# Patient Record
Sex: Male | Born: 1945 | Race: Black or African American | Hispanic: No | Marital: Married | Smoking: Never smoker
Health system: Southern US, Community
[De-identification: ages and names within clinical notes are randomized; demographics above are authoritative.]

## PROBLEM LIST (undated history)

## (undated) DIAGNOSIS — E119 Type 2 diabetes mellitus without complications: Secondary | ICD-10-CM

## (undated) DIAGNOSIS — I10 Essential (primary) hypertension: Secondary | ICD-10-CM

## (undated) HISTORY — PX: CYST EXCISION: SHX5701

## (undated) HISTORY — PX: HYDROCELE EXCISION: SHX482

---

## 2014-08-06 DIAGNOSIS — H26492 Other secondary cataract, left eye: Secondary | ICD-10-CM | POA: Diagnosis not present

## 2014-09-04 DIAGNOSIS — H26492 Other secondary cataract, left eye: Secondary | ICD-10-CM | POA: Diagnosis not present

## 2014-09-18 DIAGNOSIS — Z0000 Encounter for general adult medical examination without abnormal findings: Secondary | ICD-10-CM | POA: Diagnosis not present

## 2014-09-18 DIAGNOSIS — N529 Male erectile dysfunction, unspecified: Secondary | ICD-10-CM | POA: Diagnosis not present

## 2014-09-18 DIAGNOSIS — Z1389 Encounter for screening for other disorder: Secondary | ICD-10-CM | POA: Diagnosis not present

## 2014-09-18 DIAGNOSIS — E780 Pure hypercholesterolemia: Secondary | ICD-10-CM | POA: Diagnosis not present

## 2014-09-18 DIAGNOSIS — I10 Essential (primary) hypertension: Secondary | ICD-10-CM | POA: Diagnosis not present

## 2015-01-08 DIAGNOSIS — I10 Essential (primary) hypertension: Secondary | ICD-10-CM | POA: Diagnosis not present

## 2015-03-06 DIAGNOSIS — I10 Essential (primary) hypertension: Secondary | ICD-10-CM | POA: Diagnosis not present

## 2015-03-06 DIAGNOSIS — E785 Hyperlipidemia, unspecified: Secondary | ICD-10-CM | POA: Diagnosis not present

## 2015-03-06 DIAGNOSIS — G459 Transient cerebral ischemic attack, unspecified: Secondary | ICD-10-CM | POA: Diagnosis not present

## 2015-03-07 ENCOUNTER — Other Ambulatory Visit: Payer: Self-pay | Admitting: Family Medicine

## 2015-03-11 ENCOUNTER — Other Ambulatory Visit: Payer: Self-pay | Admitting: Family Medicine

## 2015-03-11 DIAGNOSIS — F40240 Claustrophobia: Secondary | ICD-10-CM

## 2015-03-11 DIAGNOSIS — G459 Transient cerebral ischemic attack, unspecified: Secondary | ICD-10-CM

## 2015-03-12 ENCOUNTER — Other Ambulatory Visit: Payer: Self-pay | Admitting: Family Medicine

## 2015-03-12 DIAGNOSIS — G459 Transient cerebral ischemic attack, unspecified: Secondary | ICD-10-CM

## 2015-03-16 DIAGNOSIS — I6521 Occlusion and stenosis of right carotid artery: Secondary | ICD-10-CM | POA: Diagnosis not present

## 2015-03-27 ENCOUNTER — Ambulatory Visit
Admission: RE | Admit: 2015-03-27 | Discharge: 2015-03-27 | Disposition: A | Discharge status: Home / Self Care | Payer: Commercial Managed Care - HMO | Source: Ambulatory Visit | Attending: Family Medicine | Admitting: Family Medicine

## 2015-03-27 DIAGNOSIS — G459 Transient cerebral ischemic attack, unspecified: Secondary | ICD-10-CM

## 2015-03-27 DIAGNOSIS — G8323 Monoplegia of upper limb affecting right nondominant side: Secondary | ICD-10-CM | POA: Diagnosis not present

## 2015-03-27 DIAGNOSIS — M25511 Pain in right shoulder: Secondary | ICD-10-CM | POA: Diagnosis not present

## 2015-03-27 DIAGNOSIS — F40240 Claustrophobia: Secondary | ICD-10-CM

## 2015-04-01 DIAGNOSIS — I10 Essential (primary) hypertension: Secondary | ICD-10-CM | POA: Diagnosis not present

## 2015-04-22 DIAGNOSIS — I10 Essential (primary) hypertension: Secondary | ICD-10-CM | POA: Diagnosis not present

## 2015-04-29 DIAGNOSIS — H5213 Myopia, bilateral: Secondary | ICD-10-CM | POA: Diagnosis not present

## 2015-04-29 DIAGNOSIS — Z0100 Encounter for examination of eyes and vision without abnormal findings: Secondary | ICD-10-CM | POA: Diagnosis not present

## 2015-04-29 DIAGNOSIS — H524 Presbyopia: Secondary | ICD-10-CM | POA: Diagnosis not present

## 2015-04-29 DIAGNOSIS — H5203 Hypermetropia, bilateral: Secondary | ICD-10-CM | POA: Diagnosis not present

## 2015-06-11 DIAGNOSIS — R05 Cough: Secondary | ICD-10-CM | POA: Diagnosis not present

## 2015-06-11 DIAGNOSIS — R509 Fever, unspecified: Secondary | ICD-10-CM | POA: Diagnosis not present

## 2015-07-03 DIAGNOSIS — R05 Cough: Secondary | ICD-10-CM | POA: Diagnosis not present

## 2015-09-22 DIAGNOSIS — E7800 Pure hypercholesterolemia, unspecified: Secondary | ICD-10-CM | POA: Diagnosis not present

## 2015-09-22 DIAGNOSIS — Z1389 Encounter for screening for other disorder: Secondary | ICD-10-CM | POA: Diagnosis not present

## 2015-09-22 DIAGNOSIS — I10 Essential (primary) hypertension: Secondary | ICD-10-CM | POA: Diagnosis not present

## 2015-09-22 DIAGNOSIS — Z1211 Encounter for screening for malignant neoplasm of colon: Secondary | ICD-10-CM | POA: Diagnosis not present

## 2015-09-22 DIAGNOSIS — Z0000 Encounter for general adult medical examination without abnormal findings: Secondary | ICD-10-CM | POA: Diagnosis not present

## 2015-09-22 DIAGNOSIS — N529 Male erectile dysfunction, unspecified: Secondary | ICD-10-CM | POA: Diagnosis not present

## 2015-10-02 DIAGNOSIS — Z1211 Encounter for screening for malignant neoplasm of colon: Secondary | ICD-10-CM | POA: Diagnosis not present

## 2016-01-18 IMAGING — MR MR HEAD W/O CM
10 series · 48 of 48 positions shown · non-contrast
Comparison: None.

CLINICAL DATA: Recent episode of RIGHT shoulder pain and RIGHT arm
paralysis. Stroke risk factors include hypertension. Suspected
transient cerebral ischemia.

EXAM:
MRI HEAD WITHOUT CONTRAST
TECHNIQUE: Multiplanar, multiecho pulse sequences of the brain and surrounding
structures were obtained without intravenous contrast.

[Series 2: t1_se_sag · sagittal · 5.0mm · 0.45mm/px · 3 of 21 slices shown]
[im 1/21]
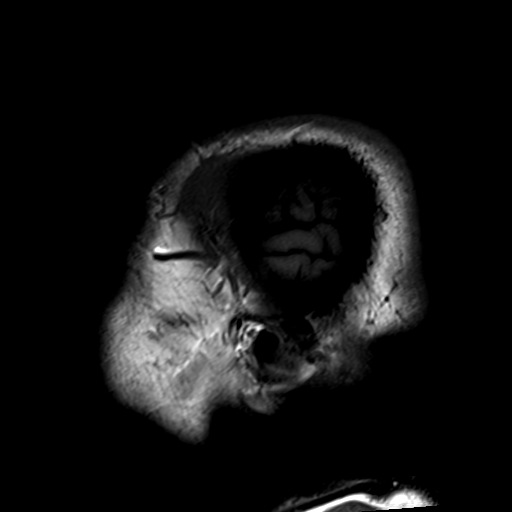
[im 11/21]
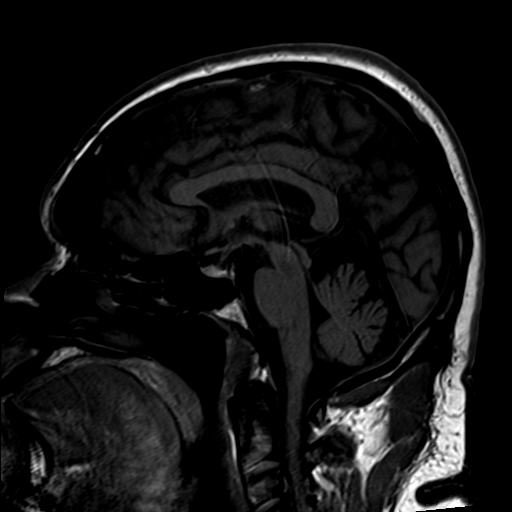
[im 21/21]
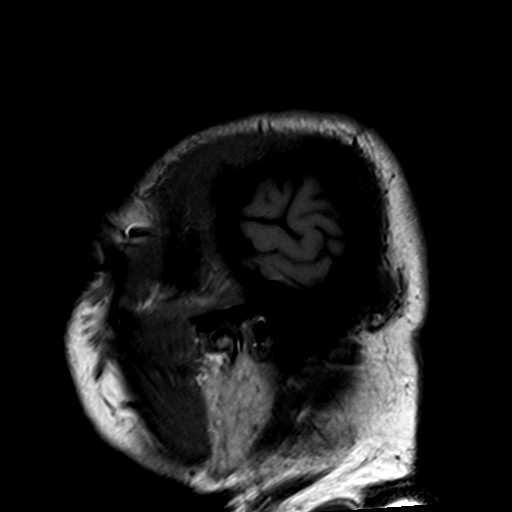

[Series 3: ep2d_diff_(id)_trace · axial · 3.0mm · 1.80mm/px · z∈[-53,+94]mm · 10 of 100 slices shown]
[im 1/100]
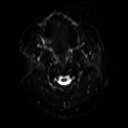
[im 12/100]
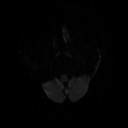
[im 23/100]
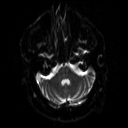
[im 34/100]
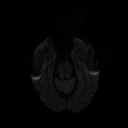
[im 45/100]
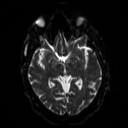
[im 56/100]
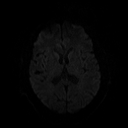
[im 67/100]
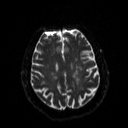
[im 78/100]
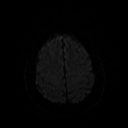
[im 89/100]
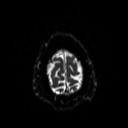
[im 100/100]
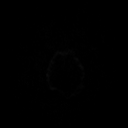

[Series 4: ep2d_diff_(id)_trace_adc · axial · 3.0mm · 1.80mm/px · z∈[-53,+94]mm · 5 of 49 slices shown]
[im 1/49]
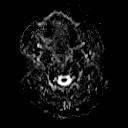
[im 13/49]
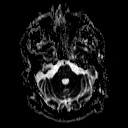
[im 25/49]
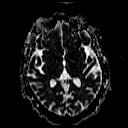
[im 37/49]
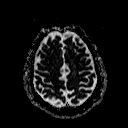
[im 49/49]
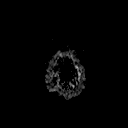

[Series 5: ep2d_diff_cor · coronal · 5.0mm · 1.77mm/px · 5 of 47 slices shown]
[im 1/47]
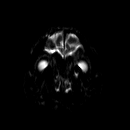
[im 12/47]
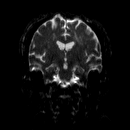
[im 24/47]
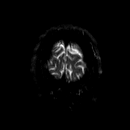
[im 35/47]
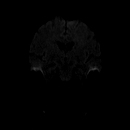
[im 47/47]
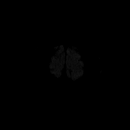

[Series 6: ep2d_diff_cor_adc · coronal · 5.0mm · 1.77mm/px · 2 of 24 slices shown]
[im 1/24]
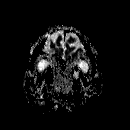
[im 24/24]
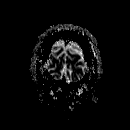

[Series 8: swi_images · axial · 2.0mm · 0.90mm/px · z∈[-58,+100]mm · 8 of 80 slices shown]
[im 1/80]
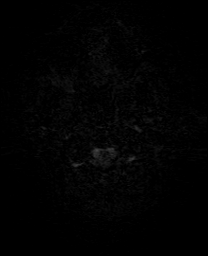
[im 12/80]
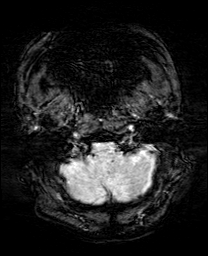
[im 23/80]
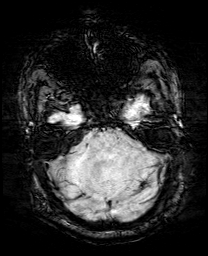
[im 34/80]
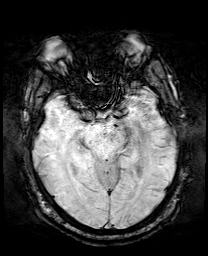
[im 46/80]
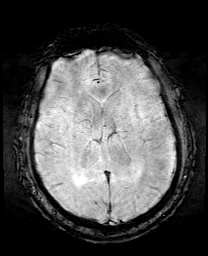
[im 57/80]
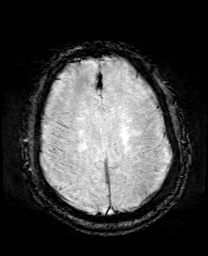
[im 68/80]
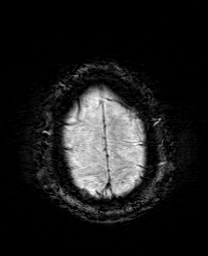
[im 80/80]
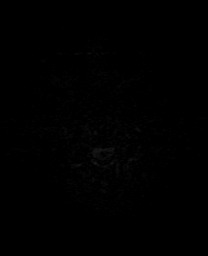

[Series 9: FLAIR · axial · 5.0mm · 0.45mm/px · z∈[-48,+89]mm · 2 of 23 slices shown]
[im 1/23]
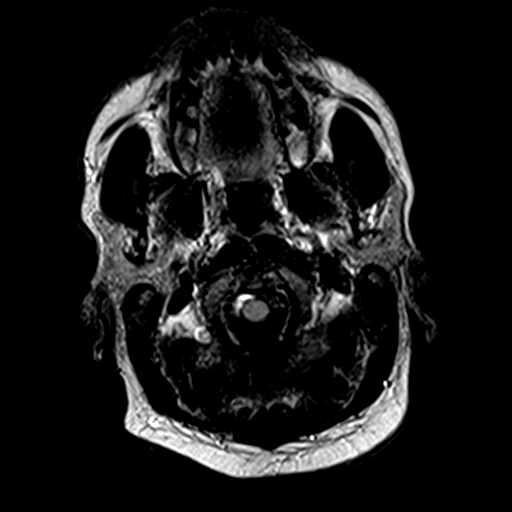
[im 23/23]
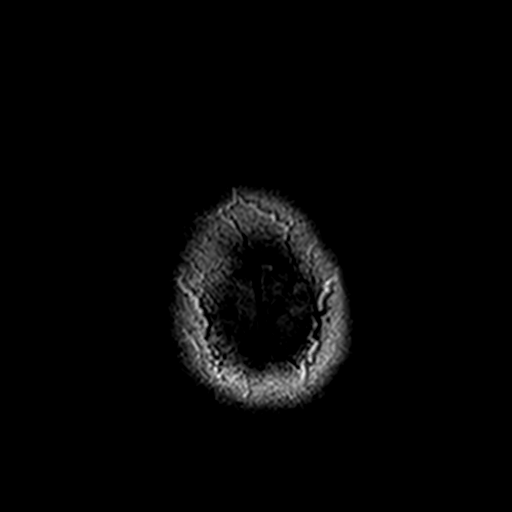

[Series 10: t2_tse_tra_512 · axial · 5.0mm · 0.60mm/px · z∈[-48,+89]mm · 2 of 23 slices shown]
[im 1/23]
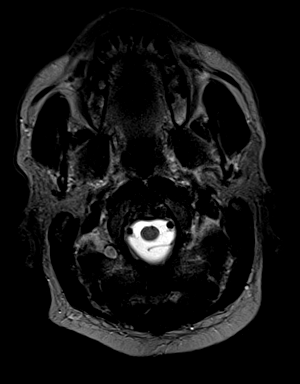
[im 23/23]
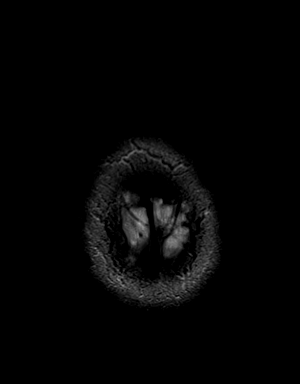

[Series 11: t1_mpr_tra · axial · 2.0mm · 0.45mm/px · z∈[-58,+100]mm · 8 of 80 slices shown]
[im 1/80]
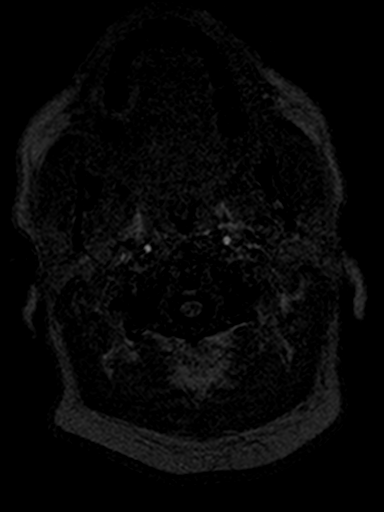
[im 12/80]
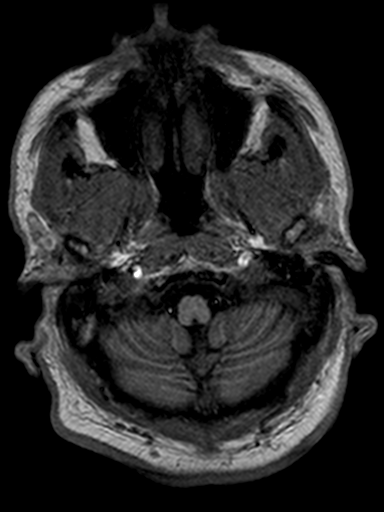
[im 23/80]
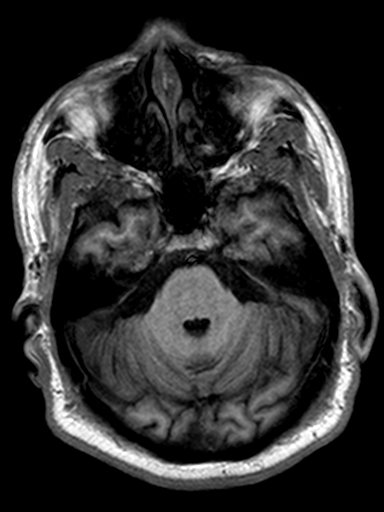
[im 34/80]
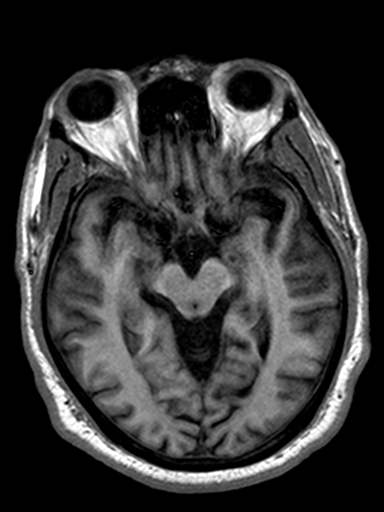
[im 46/80]
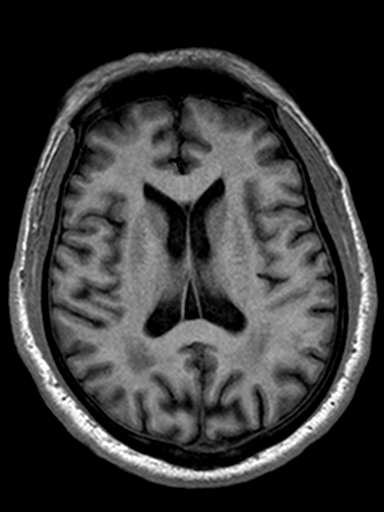
[im 57/80]
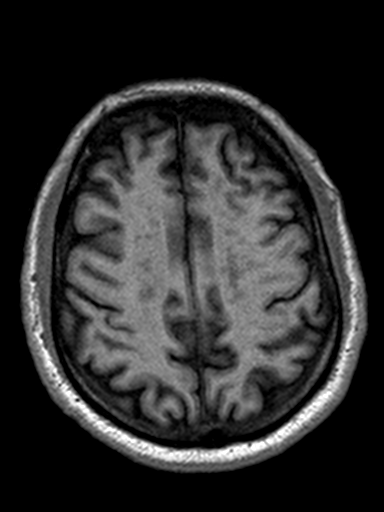
[im 68/80]
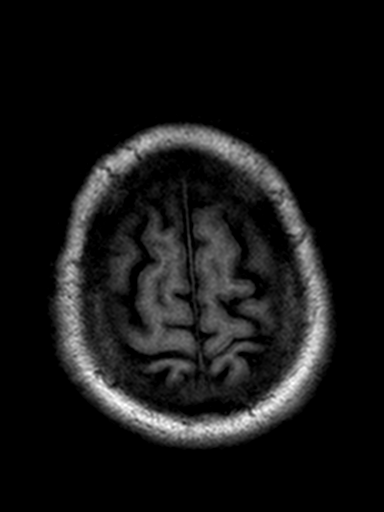
[im 80/80]
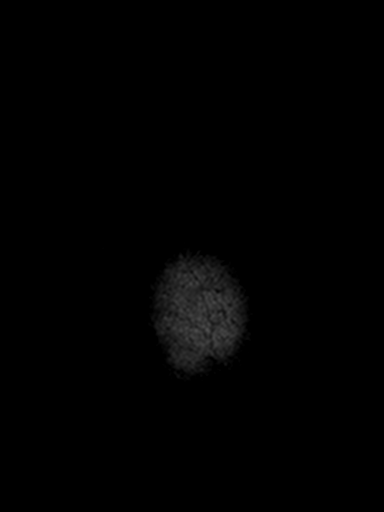

[Series 12: T2 · coronal · 5.0mm · 0.45mm/px · 3 of 26 slices shown]
[im 1/26]
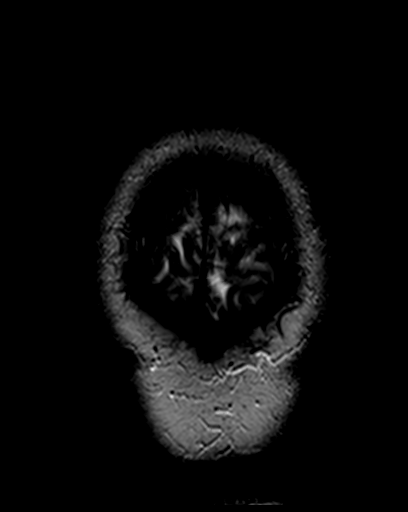
[im 13/26]
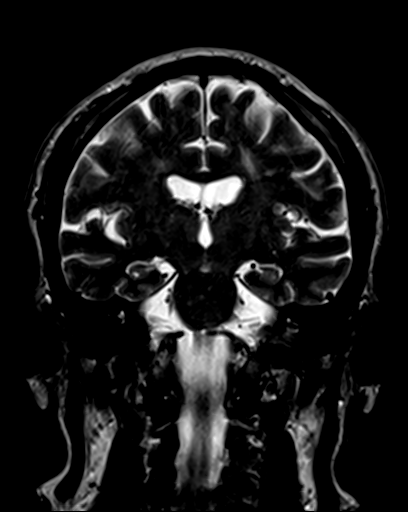
[im 26/26]
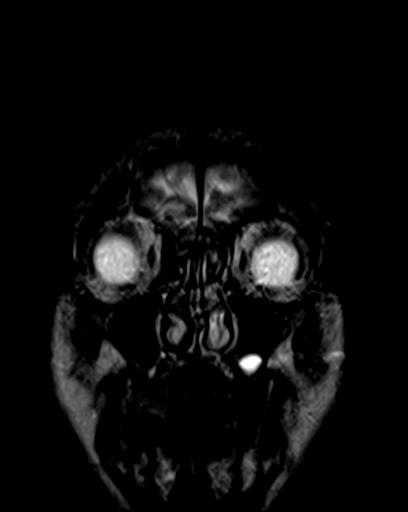

[48 of 48 positions shown; findings below may reference images not displayed]

FINDINGS: No evidence for acute infarction, hemorrhage, mass lesion,
hydrocephalus, or extra-axial fluid. Generalized atrophy. Moderately
extensive small vessel disease. No foci of chronic hemorrhage. Flow
voids are maintained. Cervical spondylosis. Normal pituitary and
cerebellar tonsils. Extracranial soft tissues unremarkable.
IMPRESSION: No acute infarct is evident.

Atrophy with moderately advanced small vessel disease. No visible
large vessel occlusion.

## 2016-03-22 DIAGNOSIS — E7800 Pure hypercholesterolemia, unspecified: Secondary | ICD-10-CM | POA: Diagnosis not present

## 2016-03-22 DIAGNOSIS — N529 Male erectile dysfunction, unspecified: Secondary | ICD-10-CM | POA: Diagnosis not present

## 2016-03-22 DIAGNOSIS — I10 Essential (primary) hypertension: Secondary | ICD-10-CM | POA: Diagnosis not present

## 2016-06-20 DIAGNOSIS — H0011 Chalazion right upper eyelid: Secondary | ICD-10-CM | POA: Diagnosis not present

## 2016-06-27 DIAGNOSIS — H26492 Other secondary cataract, left eye: Secondary | ICD-10-CM | POA: Diagnosis not present

## 2016-09-12 DIAGNOSIS — H6092 Unspecified otitis externa, left ear: Secondary | ICD-10-CM | POA: Diagnosis not present

## 2016-10-24 DIAGNOSIS — Z6837 Body mass index (BMI) 37.0-37.9, adult: Secondary | ICD-10-CM | POA: Diagnosis not present

## 2016-10-24 DIAGNOSIS — N529 Male erectile dysfunction, unspecified: Secondary | ICD-10-CM | POA: Diagnosis not present

## 2016-10-24 DIAGNOSIS — Z0000 Encounter for general adult medical examination without abnormal findings: Secondary | ICD-10-CM | POA: Diagnosis not present

## 2016-10-24 DIAGNOSIS — I10 Essential (primary) hypertension: Secondary | ICD-10-CM | POA: Diagnosis not present

## 2016-10-24 DIAGNOSIS — Z1159 Encounter for screening for other viral diseases: Secondary | ICD-10-CM | POA: Diagnosis not present

## 2016-10-24 DIAGNOSIS — E7800 Pure hypercholesterolemia, unspecified: Secondary | ICD-10-CM | POA: Diagnosis not present

## 2016-10-24 DIAGNOSIS — Z1389 Encounter for screening for other disorder: Secondary | ICD-10-CM | POA: Diagnosis not present

## 2017-01-04 ENCOUNTER — Encounter: Payer: Self-pay | Admitting: Pharmacy Technician

## 2017-01-05 ENCOUNTER — Other Ambulatory Visit: Payer: Self-pay | Admitting: Pharmacy Technician

## 2017-01-05 NOTE — Patient Outreach (Signed)
[facility]) Care Management  01/05/2017  Mike Freeman [birth year] [id number]   Contacted [facility] to verify last refill of Pravastatin 40mg . Caryl Pina verified last time script filled was June 18 for a 30 day supply.  Contacted patient in regards to Pravastatin medication adherence. No answer, left [facility] appropriate voicemail.  Maud Deed [place], [place] Management [phone]

## 2017-08-14 DEATH — deceased

## 2019-01-15 DEATH — deceased

## 2019-04-26 DIAGNOSIS — C61 Malignant neoplasm of prostate: Secondary | ICD-10-CM | POA: Diagnosis not present

## 2020-06-18 DIAGNOSIS — Z6833 Body mass index (BMI) 33.0-33.9, adult: Secondary | ICD-10-CM | POA: Diagnosis not present

## 2020-06-18 DIAGNOSIS — R7309 Other abnormal glucose: Secondary | ICD-10-CM | POA: Diagnosis not present

## 2020-12-24 DIAGNOSIS — Z961 Presence of intraocular lens: Secondary | ICD-10-CM | POA: Diagnosis not present

## 2020-12-24 DIAGNOSIS — H2511 Age-related nuclear cataract, right eye: Secondary | ICD-10-CM | POA: Diagnosis not present

## 2020-12-24 DIAGNOSIS — H524 Presbyopia: Secondary | ICD-10-CM | POA: Diagnosis not present

## 2021-10-04 DIAGNOSIS — R350 Frequency of micturition: Secondary | ICD-10-CM | POA: Diagnosis not present

## 2021-10-04 DIAGNOSIS — N401 Enlarged prostate with lower urinary tract symptoms: Secondary | ICD-10-CM | POA: Diagnosis not present

## 2021-10-04 DIAGNOSIS — R3912 Poor urinary stream: Secondary | ICD-10-CM | POA: Diagnosis not present

## 2021-12-28 DIAGNOSIS — H5201 Hypermetropia, right eye: Secondary | ICD-10-CM | POA: Diagnosis not present

## 2021-12-28 DIAGNOSIS — H2511 Age-related nuclear cataract, right eye: Secondary | ICD-10-CM | POA: Diagnosis not present

## 2021-12-28 DIAGNOSIS — H5212 Myopia, left eye: Secondary | ICD-10-CM | POA: Diagnosis not present

## 2021-12-28 DIAGNOSIS — H26492 Other secondary cataract, left eye: Secondary | ICD-10-CM | POA: Diagnosis not present

## 2022-02-10 DIAGNOSIS — R972 Elevated prostate specific antigen [PSA]: Secondary | ICD-10-CM | POA: Diagnosis not present

## 2022-02-17 DIAGNOSIS — R3912 Poor urinary stream: Secondary | ICD-10-CM | POA: Diagnosis not present

## 2022-02-17 DIAGNOSIS — N401 Enlarged prostate with lower urinary tract symptoms: Secondary | ICD-10-CM | POA: Diagnosis not present

## 2022-02-23 ENCOUNTER — Other Ambulatory Visit: Payer: Self-pay | Admitting: Urology

## 2022-02-23 DIAGNOSIS — N401 Enlarged prostate with lower urinary tract symptoms: Secondary | ICD-10-CM

## 2022-02-23 DIAGNOSIS — R3912 Poor urinary stream: Secondary | ICD-10-CM

## 2022-02-23 DIAGNOSIS — R972 Elevated prostate specific antigen [PSA]: Secondary | ICD-10-CM

## 2022-03-15 ENCOUNTER — Ambulatory Visit
Admission: RE | Admit: 2022-03-15 | Discharge: 2022-03-15 | Disposition: A | Discharge status: Home / Self Care | Payer: Medicare PPO | Source: Ambulatory Visit | Attending: Urology | Admitting: Urology

## 2022-03-15 DIAGNOSIS — R972 Elevated prostate specific antigen [PSA]: Secondary | ICD-10-CM

## 2022-03-15 DIAGNOSIS — N401 Enlarged prostate with lower urinary tract symptoms: Secondary | ICD-10-CM

## 2022-03-15 DIAGNOSIS — R3912 Poor urinary stream: Secondary | ICD-10-CM

## 2022-03-15 MED ORDER — GADOPICLENOL 0.5 MMOL/ML IV SOLN
10.0000 mL | Freq: Once | INTRAVENOUS | Status: AC | PRN
  Administered 2022-03-15: 10 mL via INTRAVENOUS

## 2022-04-21 DIAGNOSIS — R972 Elevated prostate specific antigen [PSA]: Secondary | ICD-10-CM | POA: Diagnosis not present

## 2022-04-21 DIAGNOSIS — R350 Frequency of micturition: Secondary | ICD-10-CM | POA: Diagnosis not present

## 2022-04-21 DIAGNOSIS — R3121 Asymptomatic microscopic hematuria: Secondary | ICD-10-CM | POA: Diagnosis not present

## 2022-04-21 DIAGNOSIS — N401 Enlarged prostate with lower urinary tract symptoms: Secondary | ICD-10-CM | POA: Diagnosis not present

## 2022-05-03 ENCOUNTER — Other Ambulatory Visit: Payer: Self-pay | Admitting: Urology

## 2022-05-27 NOTE — Patient Instructions (Signed)
DUE TO COVID-19 ONLY TWO VISITORS  (aged 76 and older)  ARE ALLOWED TO COME WITH YOU AND STAY IN THE WAITING ROOM ONLY DURING PRE OP AND PROCEDURE.   **NO VISITORS ARE ALLOWED IN THE SHORT STAY AREA OR RECOVERY ROOM!!**  IF YOU WILL BE ADMITTED INTO THE HOSPITAL YOU ARE ALLOWED ONLY FOUR SUPPORT PEOPLE DURING VISITATION HOURS ONLY (7 AM -8PM)   The support person(s) must pass our screening, gel in and out, and wear a mask at all times, including in the patient's room. Patients must also wear a mask when staff or their support person are in the room. Visitors GUEST BADGE MUST BE WORN VISIBLY  One adult visitor may remain with you overnight and MUST be in the room by 8 P.M.     Your procedure is scheduled on: 06/10/22   Report to [facility] Main Entrance    Report to admitting at : 6:45 AM   Call this number if you have problems the morning of surgery [phone]   Do not eat food :After Midnight.   After Midnight you may have the following liquids until : 6:00 AM DAY OF SURGERY  Water Black Coffee (sugar ok, NO MILK/CREAM OR CREAMERS)  Tea (sugar ok, NO MILK/CREAM OR CREAMERS) regular and decaf                             Plain Jell-O (NO RED)                                           Fruit ices (not with fruit pulp, NO RED)                                     Popsicles (NO RED)                                                                  Juice: apple, WHITE grape, WHITE cranberry Sports drinks like Gatorade (NO RED)             FOLLOW BOWEL PREP AND ANY ADDITIONAL PRE OP INSTRUCTIONS YOU RECEIVED FROM YOUR SURGEON'S OFFICE!!!   Oral Hygiene is also important to reduce your risk of infection.                                    Remember - BRUSH YOUR TEETH THE MORNING OF SURGERY WITH YOUR REGULAR TOOTHPASTE  DENTURES WILL BE REMOVED PRIOR TO SURGERY PLEASE DO NOT APPLY "Poly grip" OR ADHESIVES!!!   Do NOT smoke after Midnight   Take these medicines the morning of  surgery with A SIP OF WATER: amlodipine,finasteride.    How to Manage Your Diabetes Before and After Surgery  Why is it important to control my blood sugar before and after surgery? Improving blood sugar levels before and after surgery helps healing and can limit problems. A way of improving blood sugar control is eating a healthy diet by:  Eating less sugar and carbohydrates  Increasing activity/exercise  Talking with your doctor about reaching your blood sugar goals High blood sugars (greater than 180 mg/dL) can raise your risk of infections and slow your recovery, so you will need to focus on controlling your diabetes during the weeks before surgery. Make sure that the doctor who takes care of your diabetes knows about your planned surgery including the date and location.  How do I manage my blood sugar before surgery? Check your blood sugar at least 4 times a day, starting 2 days before surgery, to make sure that the level is not too high or low. Check your blood sugar the morning of your surgery when you wake up and every 2 hours until you get to the Short Stay unit. If your blood sugar is less than 70 mg/dL, you will need to treat for low blood sugar: Do not take insulin. Treat a low blood sugar (less than 70 mg/dL) with  cup of clear juice (cranberry or apple), 4 glucose tablets, OR glucose gel. Recheck blood sugar in 15 minutes after treatment (to make sure it is greater than 70 mg/dL). If your blood sugar is not greater than 70 mg/dL on recheck, call [phone] for further instructions. Report your blood sugar to the short stay nurse when you get to Short Stay.  If you are admitted to the hospital after surgery: Your blood sugar will be checked by the staff and you will probably be given insulin after surgery (instead of oral diabetes medicines) to make sure you have good blood sugar levels. The goal for blood sugar control after surgery is 80-180 mg/dL.   WHAT DO I DO ABOUT  MY DIABETES MEDICATION?  Do not take oral diabetes medicines (pills) the morning of surgery.  THE NIGHT BEFORE SURGERY, take metformin as usual.       THE MORNING OF SURGERY,DO NOT TAKE ANY ORAL DIABETIC MEDICATIONS DAY OF YOUR SURGERY  Bring CPAP mask and tubing day of surgery.                              You may not have any metal on your body including hair pins, jewelry, and body piercing             Do not wear lotions, powders, perfumes/cologne, or deodorant              Men may shave face and neck.   Do not bring valuables to the hospital. [place].   Contacts, glasses, or bridgework may not be worn into surgery.   Bring small overnight bag day of surgery.   DO NOT [place]. PHARMACY WILL DISPENSE MEDICATIONS LISTED ON YOUR MEDICATION LIST TO YOU DURING YOUR ADMISSION [place]!    Patients discharged on the day of surgery will not be allowed to drive home.  Someone NEEDS to stay with you for the first 24 hours after anesthesia.   Special Instructions: Bring a copy of your healthcare power of attorney and living will documents         the day of surgery if you haven't scanned them before.              Please read over the following fact sheets you were given: IF YOU HAVE QUESTIONS ABOUT  YOUR PRE-OP INSTRUCTIONS PLEASE CALL [phone]    [facility] - Preparing for Surgery Before surgery, you can play an important role.  Because skin is not sterile, your skin needs to be as free of germs as possible.  You can reduce the number of germs on your skin by washing with CHG (chlorahexidine gluconate) soap before surgery.  CHG is an antiseptic cleaner which kills germs and bonds with the skin to continue killing germs even after washing. Please DO NOT use if you have an allergy to CHG or antibacterial soaps.  If your skin becomes reddened/irritated stop using the CHG and inform your nurse  when you arrive at Short Stay. Do not shave (including legs and underarms) for at least 48 hours prior to the first CHG shower.  You may shave your face/neck. Please follow these instructions carefully:  1.  Shower with CHG Soap the night before surgery and the  morning of Surgery.  2.  If you choose to wash your hair, wash your hair first as usual with your  normal  shampoo.  3.  After you shampoo, rinse your hair and body thoroughly to remove the  shampoo.                           4.  Use CHG as you would any other liquid soap.  You can apply chg directly  to the skin and wash                       Gently with a scrungie or clean washcloth.  5.  Apply the CHG Soap to your body ONLY FROM THE NECK DOWN.   Do not use on face/ open                           Wound or open sores. Avoid contact with eyes, ears mouth and genitals (private parts).                       Wash face,  Genitals (private parts) with your normal soap.             6.  Wash thoroughly, paying special attention to the area where your surgery  will be performed.  7.  Thoroughly rinse your body with warm water from the neck down.  8.  DO NOT shower/wash with your normal soap after using and rinsing off  the CHG Soap.                9.  Pat yourself dry with a clean towel.            10.  Wear clean pajamas.            11.  Place clean sheets on your bed the night of your first shower and do not  sleep with pets. Day of Surgery : Do not apply any lotions/deodorants the morning of surgery.  Please wear clean clothes to the hospital/surgery center.  FAILURE TO FOLLOW THESE INSTRUCTIONS MAY RESULT IN THE CANCELLATION OF YOUR SURGERY PATIENT SIGNATURE_________________________________  NURSE SIGNATURE__________________________________  ________________________________________________________________________

## 2022-05-30 ENCOUNTER — Encounter (HOSPITAL_COMMUNITY)
Admission: RE | Admit: 2022-05-30 | Discharge: 2022-05-30 | Disposition: A | Discharge status: Home / Self Care | Payer: Medicare PPO | Source: Ambulatory Visit | Attending: Urology | Admitting: Urology

## 2022-05-30 ENCOUNTER — Other Ambulatory Visit: Payer: Self-pay

## 2022-05-30 ENCOUNTER — Encounter (HOSPITAL_COMMUNITY): Payer: Self-pay

## 2022-05-30 VITALS — BP 150/90 | HR 74 | Temp 98.0°F | Ht 70.0 in | Wt 251.0 lb

## 2022-05-30 DIAGNOSIS — I2510 Atherosclerotic heart disease of native coronary artery without angina pectoris: Secondary | ICD-10-CM | POA: Diagnosis not present

## 2022-05-30 DIAGNOSIS — E119 Type 2 diabetes mellitus without complications: Secondary | ICD-10-CM | POA: Insufficient documentation

## 2022-05-30 DIAGNOSIS — Z01818 Encounter for other preprocedural examination: Secondary | ICD-10-CM | POA: Diagnosis not present

## 2022-05-30 HISTORY — DX: Type 2 diabetes mellitus without complications: E11.9

## 2022-05-30 HISTORY — DX: Essential (primary) hypertension: I10

## 2022-05-30 LAB — BASIC METABOLIC PANEL
Anion gap: 10 (ref 5–15)
BUN: 13 mg/dL (ref 8–23)
CO2: 26 mmol/L (ref 22–32)
Calcium: 10 mg/dL (ref 8.9–10.3)
Chloride: 101 mmol/L (ref 98–111)
Creatinine, Ser: 1.03 mg/dL (ref 0.61–1.24)
GFR, Estimated: >60 mL/min (ref >60)
Glucose, Bld: 113 mg/dL — ABNORMAL HIGH (ref 70–99)
Potassium: 3.6 mmol/L (ref 3.5–5.1)
Sodium: 137 mmol/L (ref 135–145)

## 2022-05-30 LAB — CBC
HCT: 41.4 % (ref 39.0–52.0)
Hemoglobin: 13.9 g/dL (ref 13.0–17.0)
MCH: 31 pg (ref 26.0–34.0)
MCHC: 33.6 g/dL (ref 30.0–36.0)
MCV: 92.2 fL (ref 80.0–100.0)
Platelets: 296 10*3/uL (ref 150–400)
RBC: 4.49 MIL/uL (ref 4.22–5.81)
RDW: 12.1 % (ref 11.5–15.5)
WBC: 6.4 10*3/uL (ref 4.0–10.5)
nRBC: 0 % (ref 0.0–0.2)

## 2022-05-30 LAB — HEMOGLOBIN A1C
Hgb A1c MFr Bld: 6.4 % — ABNORMAL HIGH (ref 4.8–5.6)
Mean Plasma Glucose: 136.98 mg/dL

## 2022-05-30 LAB — GLUCOSE, CAPILLARY: Glucose-Capillary: 128 mg/dL — ABNORMAL HIGH (ref 70–99)

## 2022-05-30 NOTE — Progress Notes (Addendum)
For Short Stay: [place] appointment date:  Bowel Prep reminder:   For Anesthesia: PCP - [facility] Cardiologist - N/A  Chest x-ray -  EKG -  Stress Test -  ECHO -  Cardiac Cath -  Pacemaker/ICD device last checked: Pacemaker orders received: Device Rep notified:  Spinal Cord Stimulator:  Sleep Study -  CPAP -   Fasting Blood Sugar - N/A Checks Blood Sugar ___0__ times a day Date and result of last Hgb A1c-  Last dose of GLP1 agonist-  GLP1 instructions:   Last dose of SGLT-2 inhibitors-  SGLT-2 instructions:   Blood Thinner Instructions: Aspirin Instructions: Last Dose:  Activity level: Can go up a flight of stairs and activities of daily living without stopping and without chest pain and/or shortness of breath   Able to exercise without chest pain and/or shortness of breath   Anesthesia review: Hx: HTN,DIA  Patient denies shortness of breath, fever, cough and chest pain at PAT appointment   Patient verbalized understanding of instructions that were given to them at the PAT appointment. Patient was also instructed that they will need to review over the PAT instructions again at home before surgery.

## 2022-06-09 ENCOUNTER — Encounter (HOSPITAL_COMMUNITY): Payer: Self-pay | Admitting: Urology

## 2022-06-09 NOTE — H&P (Signed)
Office Visit Report     06/09/2022   --------------------------------------------------------------------------------   Mike Freeman  MRN: [id number]  DOB: Jun 22, 1945, 77 year old Male  SSN: [id number]   PRIMARY CARE:  Robert A. Alyson Ingles, MD  PRIMARY CARE FAX:  [phone]  REFERRING:  Georgette Dover, MD  PROVIDER:  Festus Aloe, M.D.  TREATING:  Daine Gravel, NP  LOCATION:  [facility] [phone]     --------------------------------------------------------------------------------   CC/HPI: F/u -   1) BPH - prior AUASS was 2. AUASS now 15. No meds or surgery. He took superbeta prostate. A friend had an in office procedure and did well and he's interested. Cystoscopy Oct 2023 with trilobar hypertrophy and BOO.    2) PSA elevation - In 2012 the patient's PSA was fairly stable around 3.7. PSA in May of 2014 was 4.35. Repeat PSA in November 2014 was 3.3.   He denies any known family history of prostate cancer.    He was seen for the above for management of elevated PSA and BPH. He did not tolerate TRUS prostate as the probe could not be placed TR. He underwent pMRI with a 8 mm PIRADS 4 right PZ. Prostate 117 g and about 7 cm in length. His May 2023 PSA was 6.6 and Sep 2023 PSA 4.87. He's been well with no dysuria or gross hematuria.    He retired from [employer]. 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   06/09/2022: Mike Freeman is a 77 year old man that presents today for a flow rate. He is scheduled to undergo aqua ablation in 2 days. He denies any bothersome complaints other than a very weak urinary stream.     ALLERGIES: No Allergies    MEDICATIONS: Metoprolol Succinate 100 mg tablet, extended release 24 hr  Amlodipine Besylate 10 mg tablet  Doxazosin Mesylate  Losartan-Hydrochlorothiazide 100 mg-25 mg tablet  Pravastatin Sodium 40 mg tablet  Valsartan 320 mg tablet     GU PSH: Cystoscopy - 02/17/2022 Remove Hydrocele - 2014       PSH Notes:  Surgery Spermatic Cord Excision Of Hydrocele Right   NON-GU PSH: No Non-GU PSH    GU PMH: BPH w/LUTS, I discussed with the patient the nature r/b/a to aquablation of prostate including side effects of the procedure, expected post-op course and likelihood of success. We discussed flow symptoms and irritative symptoms typically improve, but frequency and urgency can persist and rarely worsen. We also discussed risk of bleeding, infection, stricture, sexual dysfunction and incontinence among others. We also discussed expectations for a staged or repeat procedure in the future. We discussed alternatives procedures and medications. All questions answered. He elects to proceed. Will treat with short term finasteride peri-op. Disc FDA warnings . - 04/21/2022, large prostate on cysto with median lobe. Will characterize prostate size another way, - 02/17/2022, - 10/04/2021 Elevated PSA, Disc PSA and risk of PCa but his PSAD is low and he has a small area on MRI we will monitor. - 04/21/2022, (Stable), We discussed the nature, risks and benefits of PSA screening as well as the nature of elevated PSA (benign versus malignant). We discussed the possibility of prostate cancer and that as the PSA rises above the level of 2.5 and even over 1, the risk of prostate cancer on biopsy increases. We discussed the management of prostate cancer might include active surveillance or treatment depending on patient and cancer characteristics. In that context, we discussed the nature, risks  and benefits of continued surveillance, other lab tests, transrectal ultrasound/prostate biopsy, or prostate MRI. All questions answered. His PSA is back toward his baseline and his PSAD is nl. We need prostate size so will plan to check a pMRI. , - 02/17/2022, Elevated prostate specific antigen (PSA), - 2015 Microscopic hematuria, check CT scan - 04/21/2022 Urinary Frequency - 04/21/2022, We discussed the nature r/b of surveillance, alpha blocker, 5ari,  daily pdei, OAB meds and procedures such as TURP, LVP, LEP, RWJ, RSLP in the OR or PUL or WVT in the office. He will return for prostate [place] and cystoscopy. He does not want to take meds. , - 10/04/2021 Weak Urinary Stream - 02/17/2022, - 10/04/2021 ED due to arterial insufficiency, Erectile dysfunction due to arterial insufficiency - 2015 Premature ejaculation, Premature ejaculation - 2014    NON-GU PMH: Encounter for general adult medical examination without abnormal findings, Encounter for preventive health examination - 2015 Personal history of other diseases of the circulatory system, History of hypertension - 2014 Personal history of other endocrine, nutritional and metabolic disease, History of hypercholesterolemia - 2014 Hypercholesterolemia Hypertension Hyperthyroidism    FAMILY HISTORY: Congestive Heart Failure - Mother Diabetes - Mother Family Health Status - Father alive at age 88 - 65 In [place] Status Number - Runs In Family heart - Mother Hypertension - Mother Mother Deceased At Age 1 from diabetic complicati - Runs In Family   SOCIAL HISTORY: Marital Status: Married Preferred Language: English; Ethnicity: Not Hispanic Or Latino Has never drank.  Drinks 3 caffeinated drinks per day.     Notes: Occupation: Retired, Alcohol Use, Never A Smoker, Caffeine Use, Marital History - Currently Married   REVIEW OF SYSTEMS:    GU Review Male:   Patient reports frequent urination, stream starts and stops, trouble starting your stream, and have to strain to urinate . Patient denies burning/ pain with urination, get up at night to urinate, leakage of urine, erection problems, and penile pain.  Gastrointestinal (Upper):   Patient denies nausea, vomiting, and indigestion/ heartburn.  Gastrointestinal (Lower):   Patient denies diarrhea and constipation.  Constitutional:   Patient denies fever, night sweats, weight loss, and fatigue.  Skin:   Patient denies skin rash/ lesion  and itching.  Eyes:   Patient denies blurred vision and double vision.  Ears/ Nose/ Throat:   Patient denies sore throat and sinus problems.  Musculoskeletal:   Patient denies back pain and joint pain.  Neurological:   Patient denies headaches and dizziness.  Psychologic:   Patient denies depression and anxiety.   VITAL SIGNS:      06/09/2022 08:22 AM  BP 169/96 mmHg  Pulse 67 /min  Temperature 97.2 F / 36.2 C   MULTI-SYSTEM PHYSICAL EXAMINATION:    Constitutional: Well-nourished. No physical deformities. Normally developed. Good grooming.  Cardiovascular: Normal temperature, normal extremity pulses, no swelling, no varicosities.  Skin: No paleness, no jaundice, no cyanosis. No lesion, no ulcer, no rash.  Neurologic / Psychiatric: Oriented to time, oriented to place, oriented to person. No depression, no anxiety, no agitation.  Gastrointestinal: No mass, no tenderness, no rigidity, non obese abdomen.     Complexity of Data:  Source Of History:  Patient  Records Review:   Previous Doctor Records, Previous Patient Records  Urodynamics Review:   Review Flow Rate   02/10/22 10/04/21 03/14/13 09/13/12  PSA  Total PSA 4.87 ng/mL 6.62 ng/mL 3.35  3.78   Free PSA 0.84 ng/mL 1.37 ng/mL    %  Free PSA 17 % PSA 21 % PSA      PROCEDURES:         Flow Rate - 51741  Voided Volume: 398 cc  Flow Time: :88 min:sec  Peak Flow Rate: 9.0 cc/sec  Average Flow Rate: 4.5 cc/sec  Time of Peak Flow: 0:24 min:sec  Total Void Time: :89 min:sec   ASSESSMENT:      ICD-10 Details  1 GU:   BPH w/LUTS - N40.1 Chronic, Stable  2   Weak Urinary Stream - 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 His curve was flat. His peak flow time was 9 seconds. He voided 398 mL. He is scheduled to undergo aqua ablation on 06/10/2022. He will keep that appointment as scheduled.        Next  Appointment:      Next Appointment: 06/10/2022 09:00 AM    Appointment Type: Surgery     Location: [facility] [phone]    Provider: Festus Aloe, M.D.    Reason for Visit: [place]      * Signed by Daine Gravel, NP on 06/09/22 at 9:22 AM (EST)*      The information contained in this medical record document is considered private and confidential patient information. This information can only be used for the medical diagnosis and/or medical services that are being provided by the patient's selected caregivers. This information can only be distributed outside of the patient's care if the patient agrees and signs waivers of authorization for this information to be sent to an outside source or route.

## 2022-06-10 ENCOUNTER — Encounter (HOSPITAL_COMMUNITY)
Admission: RE | Disposition: A | Discharge status: Home / Self Care | Payer: Self-pay | Source: Home / Self Care | Attending: Urology

## 2022-06-10 ENCOUNTER — Ambulatory Visit (HOSPITAL_COMMUNITY): Payer: Medicare PPO | Admitting: Anesthesiology

## 2022-06-10 ENCOUNTER — Observation Stay (HOSPITAL_COMMUNITY)
Admission: RE | Admit: 2022-06-10 | Discharge: 2022-06-11 | Disposition: A | Discharge status: Home / Self Care | Payer: Medicare PPO | Attending: Urology | Admitting: Urology

## 2022-06-10 ENCOUNTER — Encounter (HOSPITAL_COMMUNITY): Payer: Self-pay | Admitting: Urology

## 2022-06-10 ENCOUNTER — Ambulatory Visit (HOSPITAL_BASED_OUTPATIENT_CLINIC_OR_DEPARTMENT_OTHER): Payer: Medicare PPO | Admitting: Anesthesiology

## 2022-06-10 DIAGNOSIS — Z7984 Long term (current) use of oral hypoglycemic drugs: Secondary | ICD-10-CM

## 2022-06-10 DIAGNOSIS — E119 Type 2 diabetes mellitus without complications: Secondary | ICD-10-CM

## 2022-06-10 DIAGNOSIS — E039 Hypothyroidism, unspecified: Secondary | ICD-10-CM | POA: Insufficient documentation

## 2022-06-10 DIAGNOSIS — N401 Enlarged prostate with lower urinary tract symptoms: Secondary | ICD-10-CM

## 2022-06-10 DIAGNOSIS — R350 Frequency of micturition: Secondary | ICD-10-CM

## 2022-06-10 DIAGNOSIS — R3912 Poor urinary stream: Secondary | ICD-10-CM

## 2022-06-10 DIAGNOSIS — N138 Other obstructive and reflux uropathy: Secondary | ICD-10-CM | POA: Diagnosis present

## 2022-06-10 DIAGNOSIS — Z79899 Other long term (current) drug therapy: Secondary | ICD-10-CM | POA: Insufficient documentation

## 2022-06-10 DIAGNOSIS — I10 Essential (primary) hypertension: Secondary | ICD-10-CM | POA: Diagnosis not present

## 2022-06-10 LAB — GLUCOSE, CAPILLARY
Glucose-Capillary: 142 mg/dL — ABNORMAL HIGH (ref 70–99)
Glucose-Capillary: 128 mg/dL — ABNORMAL HIGH (ref 70–99)
Glucose-Capillary: 148 mg/dL — ABNORMAL HIGH (ref 70–99)

## 2022-06-10 SURGERY — ABLATION, PROSTATE, TRANSURETHRAL, USING WATERJET
Anesthesia: General

## 2022-06-10 MED ORDER — FENTANYL CITRATE (PF) 100 MCG/2ML IJ SOLN
INTRAMUSCULAR | Status: AC
  Filled 2022-06-10: qty 2

## 2022-06-10 MED ORDER — DIPHENHYDRAMINE HCL 50 MG/ML IJ SOLN: 12.5000 mg | Freq: Four times a day (QID) | INTRAMUSCULAR | Status: DC | PRN

## 2022-06-10 MED ORDER — ACETAMINOPHEN 325 MG PO TABS: 650.0000 mg | ORAL_TABLET | ORAL | Status: DC | PRN

## 2022-06-10 MED ORDER — FENTANYL CITRATE (PF) 100 MCG/2ML IJ SOLN
INTRAMUSCULAR | Status: DC | PRN
  Administered 2022-06-10: 100 ug via INTRAVENOUS
  Administered 2022-06-10: 50 ug via INTRAVENOUS

## 2022-06-10 MED ORDER — DOCUSATE SODIUM 100 MG PO CAPS
100.0000 mg | ORAL_CAPSULE | Freq: Two times a day (BID) | ORAL | Status: DC
  Administered 2022-06-11: 100 mg via ORAL
  Filled 2022-06-10: qty 1

## 2022-06-10 MED ORDER — SODIUM CHLORIDE 0.9% FLUSH
3.0000 mL | Freq: Two times a day (BID) | INTRAVENOUS | Status: DC
  Administered 2022-06-10 – 2022-06-11 (×2): 3 mL via INTRAVENOUS

## 2022-06-10 MED ORDER — ONDANSETRON HCL 4 MG/2ML IJ SOLN
INTRAMUSCULAR | Status: DC | PRN
  Administered 2022-06-10: 4 mg via INTRAVENOUS

## 2022-06-10 MED ORDER — DIPHENHYDRAMINE HCL 12.5 MG/5ML PO ELIX: 12.5000 mg | ORAL_SOLUTION | Freq: Four times a day (QID) | ORAL | Status: DC | PRN

## 2022-06-10 MED ORDER — FINASTERIDE 5 MG PO TABS
5.0000 mg | ORAL_TABLET | Freq: Every day | ORAL | Status: DC
  Administered 2022-06-10 – 2022-06-11 (×2): 5 mg via ORAL
  Filled 2022-06-10 (×2): qty 1

## 2022-06-10 MED ORDER — LOSARTAN POTASSIUM-HCTZ 100-25 MG PO TABS: 1.0000 | ORAL_TABLET | Freq: Every day | ORAL | Status: DC

## 2022-06-10 MED ORDER — PHENYLEPHRINE HCL (PRESSORS) 10 MG/ML IV SOLN
INTRAVENOUS | Status: DC | PRN
  Administered 2022-06-10: 80 ug via INTRAVENOUS

## 2022-06-10 MED ORDER — OXYCODONE HCL 5 MG PO TABS: 5.0000 mg | ORAL_TABLET | Freq: Once | ORAL | Status: DC | PRN

## 2022-06-10 MED ORDER — FENTANYL CITRATE PF 50 MCG/ML IJ SOSY
PREFILLED_SYRINGE | INTRAMUSCULAR | Status: AC
  Filled 2022-06-10: qty 1

## 2022-06-10 MED ORDER — ROCURONIUM BROMIDE 10 MG/ML (PF) SYRINGE
PREFILLED_SYRINGE | INTRAVENOUS | Status: DC | PRN
  Administered 2022-06-10: 20 mg via INTRAVENOUS
  Administered 2022-06-10: 60 mg via INTRAVENOUS

## 2022-06-10 MED ORDER — CIPROFLOXACIN HCL 500 MG PO TABS
500.0000 mg | ORAL_TABLET | Freq: Every day | ORAL | Status: AC
  Administered 2022-06-11: 500 mg via ORAL
  Filled 2022-06-10: qty 1

## 2022-06-10 MED ORDER — CEPHALEXIN 500 MG PO CAPS: 500.0000 mg | ORAL_CAPSULE | Freq: Every evening | ORAL | 0 refills | Status: AC

## 2022-06-10 MED ORDER — LIDOCAINE 2% (20 MG/ML) 5 ML SYRINGE
INTRAMUSCULAR | Status: DC | PRN
  Administered 2022-06-10: 80 mg via INTRAVENOUS

## 2022-06-10 MED ORDER — SODIUM CHLORIDE 0.9 % IR SOLN
Status: DC | PRN
  Administered 2022-06-10: 15000 mL via INTRAVESICAL

## 2022-06-10 MED ORDER — SODIUM CHLORIDE 0.9% FLUSH: 3.0000 mL | INTRAVENOUS | Status: DC | PRN

## 2022-06-10 MED ORDER — OXYBUTYNIN CHLORIDE 5 MG PO TABS: 5.0000 mg | ORAL_TABLET | Freq: Three times a day (TID) | ORAL | Status: DC | PRN

## 2022-06-10 MED ORDER — DEXAMETHASONE SODIUM PHOSPHATE 10 MG/ML IJ SOLN
INTRAMUSCULAR | Status: DC | PRN
  Administered 2022-06-10: 4 mg via INTRAVENOUS

## 2022-06-10 MED ORDER — ROCURONIUM BROMIDE 10 MG/ML (PF) SYRINGE
PREFILLED_SYRINGE | INTRAVENOUS | Status: AC
  Filled 2022-06-10: qty 10

## 2022-06-10 MED ORDER — PROMETHAZINE HCL 25 MG/ML IJ SOLN: 6.2500 mg | INTRAMUSCULAR | Status: DC | PRN

## 2022-06-10 MED ORDER — ACETAMINOPHEN 500 MG PO TABS
1000.0000 mg | ORAL_TABLET | Freq: Once | ORAL | Status: AC
  Administered 2022-06-10: 1000 mg via ORAL
  Filled 2022-06-10: qty 2

## 2022-06-10 MED ORDER — SUGAMMADEX SODIUM 200 MG/2ML IV SOLN
INTRAVENOUS | Status: DC | PRN
  Administered 2022-06-10: 300 mg via INTRAVENOUS

## 2022-06-10 MED ORDER — OXYCODONE HCL 5 MG/5ML PO SOLN: 5.0000 mg | Freq: Once | ORAL | Status: DC | PRN

## 2022-06-10 MED ORDER — PROPOFOL 10 MG/ML IV BOLUS
INTRAVENOUS | Status: DC | PRN
  Administered 2022-06-10: 200 mg via INTRAVENOUS

## 2022-06-10 MED ORDER — PHENYLEPHRINE 80 MCG/ML (10ML) SYRINGE FOR IV PUSH (FOR BLOOD PRESSURE SUPPORT)
PREFILLED_SYRINGE | INTRAVENOUS | Status: AC
  Filled 2022-06-10: qty 10

## 2022-06-10 MED ORDER — MEPERIDINE HCL 50 MG/ML IJ SOLN: 6.2500 mg | INTRAMUSCULAR | Status: DC | PRN

## 2022-06-10 MED ORDER — CEFAZOLIN SODIUM-DEXTROSE 2-4 GM/100ML-% IV SOLN
2.0000 g | INTRAVENOUS | Status: AC
  Administered 2022-06-10: 2 g via INTRAVENOUS
  Filled 2022-06-10: qty 100

## 2022-06-10 MED ORDER — MIDAZOLAM HCL 2 MG/2ML IJ SOLN: 0.5000 mg | Freq: Once | INTRAMUSCULAR | Status: DC | PRN

## 2022-06-10 MED ORDER — SODIUM CHLORIDE 0.9 % IV SOLN: 250.0000 mL | INTRAVENOUS | Status: DC | PRN

## 2022-06-10 MED ORDER — LACTATED RINGERS IV SOLN: INTRAVENOUS | Status: DC

## 2022-06-10 MED ORDER — LOSARTAN POTASSIUM 50 MG PO TABS
100.0000 mg | ORAL_TABLET | Freq: Every day | ORAL | Status: DC
  Administered 2022-06-10 – 2022-06-11 (×2): 100 mg via ORAL
  Filled 2022-06-10 (×2): qty 2

## 2022-06-10 MED ORDER — SODIUM CHLORIDE 0.9 % IR SOLN
3000.0000 mL | Status: DC
  Administered 2022-06-10: 6000 mL
  Administered 2022-06-11: 3000 mL
  Administered 2022-06-11: 6000 mL

## 2022-06-10 MED ORDER — HYDROCHLOROTHIAZIDE 25 MG PO TABS
25.0000 mg | ORAL_TABLET | Freq: Every day | ORAL | Status: DC
  Administered 2022-06-10 – 2022-06-11 (×2): 25 mg via ORAL
  Filled 2022-06-10 (×2): qty 1

## 2022-06-10 MED ORDER — 0.9 % SODIUM CHLORIDE (POUR BTL) OPTIME
TOPICAL | Status: DC | PRN
  Administered 2022-06-10: 1000 mL

## 2022-06-10 MED ORDER — ZOLPIDEM TARTRATE 5 MG PO TABS: 5.0000 mg | ORAL_TABLET | Freq: Every evening | ORAL | Status: DC | PRN

## 2022-06-10 MED ORDER — ORAL CARE MOUTH RINSE: 15.0000 mL | Freq: Once | OROMUCOSAL | Status: AC

## 2022-06-10 MED ORDER — STERILE WATER FOR IRRIGATION IR SOLN
Status: DC | PRN
  Administered 2022-06-10: 500 mL

## 2022-06-10 MED ORDER — METFORMIN HCL 500 MG PO TABS
500.0000 mg | ORAL_TABLET | Freq: Two times a day (BID) | ORAL | Status: DC
  Administered 2022-06-11: 500 mg via ORAL
  Filled 2022-06-10: qty 1

## 2022-06-10 MED ORDER — AMLODIPINE BESYLATE 10 MG PO TABS
10.0000 mg | ORAL_TABLET | Freq: Every day | ORAL | Status: DC
  Administered 2022-06-11: 10 mg via ORAL
  Filled 2022-06-10: qty 1

## 2022-06-10 MED ORDER — OXYCODONE-ACETAMINOPHEN 5-325 MG PO TABS: 1.0000 | ORAL_TABLET | ORAL | Status: DC | PRN

## 2022-06-10 MED ORDER — CHLORHEXIDINE GLUCONATE 0.12 % MT SOLN
15.0000 mL | Freq: Once | OROMUCOSAL | Status: AC
  Administered 2022-06-10: 15 mL via OROMUCOSAL

## 2022-06-10 MED ORDER — FENTANYL CITRATE PF 50 MCG/ML IJ SOSY
25.0000 ug | PREFILLED_SYRINGE | INTRAMUSCULAR | Status: DC | PRN
  Administered 2022-06-10 (×2): 50 ug via INTRAVENOUS

## 2022-06-10 SURGICAL SUPPLY — 28 items
BAG URINE DRAIN 2000ML AR STRL (UROLOGICAL SUPPLIES) ×1 IMPLANT
CANISTER SUCT 3000ML PPV (MISCELLANEOUS) ×1 IMPLANT
CATH HEMA 3WAY 30CC 22FR COUDE (CATHETERS) IMPLANT
CATH HEMA 3WAY 30CC 24FR COUDE (CATHETERS) IMPLANT
COVER MAYO STAND STRL (DRAPES) ×1 IMPLANT
DRAPE FOOT SWITCH (DRAPES) ×1 IMPLANT
GEL ULTRASOUND 8.5O AQUASONIC (MISCELLANEOUS) ×1 IMPLANT
GLOVE SURG LX STRL 7.5 STRW (GLOVE) ×1 IMPLANT
GOWN STRL REUS W/ TWL XL LVL3 (GOWN DISPOSABLE) ×1 IMPLANT
GOWN STRL REUS W/TWL XL LVL3 (GOWN DISPOSABLE) ×1
HANDPIECE AQUABEAM (MISCELLANEOUS) ×1 IMPLANT
HOLDER FOLEY CATH W/STRAP (MISCELLANEOUS) IMPLANT
IV NS IRRIG 3000ML ARTHROMATIC (IV SOLUTION) IMPLANT
LOOP CUT BIPOLAR 24F LRG (ELECTROSURGICAL) IMPLANT
MANIFOLD NEPTUNE II (INSTRUMENTS) ×1 IMPLANT
MAT ABSORB  FLUID 56X50 GRAY (MISCELLANEOUS) ×1
MAT ABSORB FLUID 56X50 GRAY (MISCELLANEOUS) ×1 IMPLANT
NS IRRIG 1000ML POUR BTL (IV SOLUTION) IMPLANT
PACK CYSTO (CUSTOM PROCEDURE TRAY) ×1 IMPLANT
PACK DRAPE AQUABEAM (MISCELLANEOUS) ×1 IMPLANT
SYR 30ML LL (SYRINGE) ×1 IMPLANT
SYR TOOMEY IRRIG 70ML (MISCELLANEOUS) ×3
SYRINGE TOOMEY IRRIG 70ML (MISCELLANEOUS) ×2 IMPLANT
TOWEL OR 17X24 6PK STRL BLUE (TOWEL DISPOSABLE) ×1 IMPLANT
TUBING CONNECTING 10 (TUBING) ×1 IMPLANT
TUBING UROLOGY SET (TUBING) ×1 IMPLANT
UNDERPAD 30X36 HEAVY ABSORB (UNDERPADS AND DIAPERS) ×1 IMPLANT
WATER STERILE IRR 500ML POUR (IV SOLUTION) IMPLANT

## 2022-06-10 NOTE — Interval H&P Note (Signed)
History and Physical Interval Note:  06/10/2022 12:04 PM  I should also add we discussed PSA elevation - benign and malignant cause. Discussed importance of continued DRE and PSA monitoring.    Festus Aloe

## 2022-06-10 NOTE — Progress Notes (Signed)
Mike Freeman is still a little red (Grade III) off CBI, so we'll keep him overnight on CBI. He is well. Eating and drinking OK. Abd soft.   . Vitals:   06/10/22 1600 06/10/22 1615  BP: (!) 153/95 (!) 155/85  Pulse: 70 68  Resp: 17 15  Temp: 98 F (36.7 C)   SpO2: 98% 100%

## 2022-06-10 NOTE — Discharge Instructions (Signed)
Waterjet (Aquablation) Transurethral Resection of the Prostate, Care After  The following information offers guidance on how to care for yourself after your procedure. Your health care provider may also give you more specific instructions. If you have problems or questions, contact your health care provider. What can I expect after the procedure? After the procedure, it is common to have: Mild pain in your lower abdomen. Soreness or mild discomfort in your penis or when you urinate. This is from having the catheter inserted during the procedure. A sudden urge to urinate (urgency). A need to urinate often. A small amount of blood in your urine. You may notice some small blood clots in your urine. These are normal. Follow these instructions at home: Medicines Take over-the-counter and prescription medicines only as told by your health care provider. If you were prescribed an antibiotic medicine, take it as told by your health care provider. Do not stop taking the antibiotic even if you start to feel better. Activity  Rest as told by your health care provider. Avoid sitting for a long time without moving. Get up to take short walks every 1-2 hours. This is important to improve blood flow and breathing. Ask for help if you feel weak or unsteady. You may increase your physical activity gradually as you start to feel better. Do not drive or operate machinery until your health care provider says that it is safe. Do not ride in a car for long periods of time, or as told by your health care provider. Avoid intense physical activity for as long as told by your health care provider. Do not lift anything that is heavier than 10 lb (4.5 kg), or the limit that you are told, until your health care provider says that it is safe. Do not have sex until your health care provider approves. Return to your normal activities as told by your health care provider. Ask your health care provider what activities are safe  for you. Preventing constipation  You may need to take these actions to prevent or treat constipation: Drink enough fluid to keep your urine pale yellow. Take over-the-counter or prescription medicines. Eat foods that are high in fiber, such as beans, whole grains, and fresh fruits and vegetables. Limit foods that are high in fat and processed sugars, such as fried or sweet foods.   General instructions Do not strain when you have a bowel movement. Straining may lead to bleeding from the prostate. This may cause blood clots and trouble urinating. Do not use any products that contain nicotine or tobacco. These products include cigarettes, chewing tobacco, and vaping devices, such as e-cigarettes. If you need help quitting, ask your health care provider. If you go home with a tube draining your urine (urinary catheter), care for the catheter as told by your health care provider. Wear compression stockings as told by your health care provider. These stockings help to prevent blood clots and reduce swelling in your legs. Keep all follow-up visits. This is important. Contact a health care provider if: You have signs of infection, such as: Fever or chills. Urine that smells very bad. Swelling around your urethra that is getting worse. Swelling in your penis or testicles. You have difficulty urinating. You have pain that gets worse or does not improve with medicine. You have blood in your urine that does not go away after 1 week of resting and drinking more fluids. You have trouble having a bowel movement. You have trouble having or keeping an erection.  No semen comes out during orgasm (dry ejaculation). You have a urinary catheter in place, and you have: Spasms or pain. Problems with your catheter or your catheter is blocked. Get help right away if: You are unable to urinate. You are having more blood clots in your urine instead of fewer. You have: Large blood clots. A lot of blood in  your urine. Pain in your back or lower abdomen. You have difficulty breathing or shortness of breath. You develop swelling or pain in your leg. These symptoms may be an emergency. Get help right away. Call 911. Do not wait to see if the symptoms will go away. Do not drive yourself to the hospital. Summary After the procedure, it is common to have a small amount of blood in your urine. Follow restrictions about lifting and sexual activity as told by your health care provider. Ask what activities are safe for you. Keep all follow-up visits. This is important. This information is not intended to replace advice given to you by your health care provider. Make sure you discuss any questions you have with your health care provider. Document Revised: 01/26/2021 Document Reviewed: 01/26/2021 Elsevier Patient Education  [place].

## 2022-06-10 NOTE — Interval H&P Note (Signed)
History and Physical Interval Note:  06/10/2022 9:19 AM  Mike Freeman  has presented today for surgery, with the diagnosis of [place].  The various methods of treatment have been discussed with the patient and family. After consideration of risks, benefits and other options for treatment, the patient has consented to  Procedure(s) with comments: TRANSURETHRAL WATERJET ABLATION OF PROSTATE (N/A) - 90 MINS FOR CASE as a surgical intervention.  The patient's history has been reviewed, patient examined, no change in status, stable for surgery.  I have reviewed the patient's chart and labs.  He is here with his son [name].  We went over again the nature risk benefits and alternatives to aqua ablation.  We discussed expectations for flow and irritative symptoms.  We discussed the uroflow.  We discussed urodynamics.  We also discussed alternatives again such as the nature risk and benefits of alpha blockers and long-term 5 alpha reductase inhibitor use including side effects. He remains resolute to pursue surgical intervention.  He has been well otherwise without cough cold or congestion.  No fever.  No dysuria or gross hematuria. We discussed the likelihood of the patient achieving the goals of the procedure, and any potential problems that might occur during the procedure or recuperation as well as foley care. All questions answered. Patient elects to proceed.     Festus Aloe

## 2022-06-10 NOTE — Anesthesia Preprocedure Evaluation (Addendum)
Anesthesia Evaluation  Patient identified by MRN, date of birth, ID band Patient awake    Reviewed: Allergy & Precautions, NPO status , Patient's Chart, lab work & pertinent test results  History of Anesthesia Complications Negative for: history of anesthetic complications  Airway Mallampati: II  TM Distance: >3 FB Neck ROM: Full    Dental  (+) Dental Advisory Given   Pulmonary sleep apnea (does not use his CPAP regularly)    breath sounds clear to auscultation       Cardiovascular hypertension, Pt. on medications (-) angina  Rhythm:Regular Rate:Normal     Neuro/Psych negative neurological ROS     GI/Hepatic negative GI ROS, Neg liver ROS,,,  Endo/Other  diabetes (glu 142), Oral Hypoglycemic Agents    Renal/GU negative Renal ROS     Musculoskeletal   Abdominal  (+) + obese  Peds  Hematology negative hematology ROS (+)   Anesthesia Other Findings   Reproductive/Obstetrics                             Anesthesia Physical Anesthesia Plan  ASA: 3  Anesthesia Plan: General   Post-op Pain Management: Tylenol PO (pre-op)*   Induction: Intravenous  PONV Risk Score and Plan: 2 and Ondansetron and Dexamethasone  Airway Management Planned: LMA  Additional Equipment: None  Intra-op Plan:   Post-operative Plan:   Informed Consent: I have reviewed the patients History and Physical, chart, labs and discussed the procedure including the risks, benefits and alternatives for the proposed anesthesia with the patient or authorized representative who has indicated his/her understanding and acceptance.     Dental advisory given  Plan Discussed with: CRNA and Surgeon  Anesthesia Plan Comments:         Anesthesia Quick Evaluation

## 2022-06-10 NOTE — Transfer of Care (Signed)
Immediate Anesthesia Transfer of Care Note  Patient: Mike Freeman  Procedure(s) Performed: Procedure(s) with comments: TRANSURETHRAL WATERJET ABLATION OF PROSTATE (N/A) - 90 MINS FOR CASE  Patient Location: PACU  Anesthesia Type:General  Level of Consciousness:  sedated, patient cooperative and responds to stimulation  Airway & Oxygen Therapy:Patient Spontanous Breathing and Patient connected to face mask oxgen  Post-op Assessment:  Report given to PACU RN and Post -op Vital signs reviewed and stable  Post vital signs:  Reviewed and stable  Last Vitals:  Vitals:   06/10/22 0737  BP: 124/88  Pulse: 75  Resp: 18  Temp: 36.9 C  SpO2: 44%    Complications: No apparent anesthesia complications

## 2022-06-10 NOTE — Op Note (Signed)
Preoperative diagnosis: BPH with lower urinary tract symptoms, weak stream, frequency  Postoperative diagnosis: Same   Procedure: Robotic water jet ablation of the prostate   Surgeon: Junious Silk   Anesthesia: General   Indication for procedure:   Findings:  On exam under anesthesia the penis was uncircumcised.  Foreskin was normal.  Glans and meatus appeared normal.  Scrotum appeared normal.  On DRE the prostate was about 80 g and smooth without hard area or nodule.  Cystoscopy revealed mainly trilobar hypertrophy with intravesical extension of the prostate into the bladder about 15 mm.  Description of procedure:  He was brought to the operating room and placed supine on the operating table.  After adequate anesthesia he was placed lithotomy position. Timeout was performed to confirm the patient and procedure. TRUS Stepper was mounted to the Articulating Arm and secured to OR bed. The ultrasound probe was attached to the stepper.  Exam under anesthesia was performed and the TRUS was inserted per rectum.  There was no resistance.  The ultrasound probe was aligned, and confirmation made that the prostate is centered and aligned using both transverse and sagittal views. The bladder neck, verumontanum and the central/transition zones were identified.  Genitalia were prepped and draped in the usual sterile fashion. The 33F AQUABEAM Handpiece is inserted into the prostatic urethra and a complete cystoscopic evaluation was performed by inspecting the prostate, bladder, and identifying the location of the verumontanum/external sphincter. The AQUABEAM Handpiece was secured to the Handpiece Articulating Arm. Confirmed alignment of AQUABEAM Handpiece and TRUS Probe to be parallel and colinear. Confirmation that AQUABEAM nozzle is centered and anterior of the bladder neck or the median lobe. The cystoscope was then retracted to visualize the verumontanum and external sphincter and the cystoscope tip was  positioned just proximal to the external sphincter. Reconfirmed alignment of the TRUS probe with the AQUABEAM Handpiece and compression applied with TRUS probe. Horizontal alignment of the Handpiece waterjet nozzle was performed. The Aquablation treatment zones were planned utilizing real-time TRUS to visualize the contour of the prostate and the depth and radial angles of resection were defined in the transverse view. In the sagittal view, the AQUABEAM nozzle is identified and position registered with software. The treatment contours were then adjusted to conform to the intended resection margins. The median lobe, bladder neck and verumontanum were marked and confirmed in the treatment contour. The Aquablation Treatment was then started following the resection contour confirmed under ultrasound guidance. TOTAL AQUABLATION RESECTION TIME: 10 min and 4 sec Once Aquablation resection was complete the 24 French aqua beam handpiece was carefully removed.  The continuous-flow sheath with the visual obturator was passed and then the loop and handle.  The trigone and the ureteral orifices were identified.  Resection of some residual tisse at the bladder neck tissue was done.  The bladder neck was identified at 6:00 and this was taken up to 12:00 with fulguration of the bladder neck and prostate for hemostasis.  Some residual anterior tissue was resected.  Similarly from 6:00 up to 12:00 on the left side of the bladder neck was identified by resecting some of the ablated tissue to identify the bladder neck and cauterize any bleeding.  On the left there was still a sizable anterior component extending into the bladder.  This was resected back to the bladder neck.  This created excellent hemostasis.  All the chips were evacuated.  Ureteral orifices again identified and noted to be normal without injury.  In the mid prostate  there was good resection, there was some residual anterior tissue but did not appear to be  obstructing.  We may need to revisit this in the future.  We left some tissue at the verumontanum purposefully in the very protection zone.  The scope was backed out and a 22 [place] hematuria catheter was placed with 30 cc in the balloon.  The balloon was seated at the bladder neck and it was irrigated on light traction and noted to be clear to pink.  He was hooked up to CBI.  He was cleaned up and placed supine.  Catheter was placed on traction.  He was awakened and taken to the cover room in stable condition.  Complications: None  Blood loss: 100 mL  Specimens: None  Drains: 24 [place] three-way hematuria catheter with 30 cc in the balloon  Disposition: Patient stable to PACU

## 2022-06-10 NOTE — Anesthesia Postprocedure Evaluation (Signed)
Anesthesia Post Note  Patient: Mike Freeman  Procedure(s) Performed: TRANSURETHRAL WATERJET ABLATION OF PROSTATE     Patient location during evaluation: PACU Anesthesia Type: General Level of consciousness: awake and alert, patient cooperative and oriented Pain management: pain level controlled Vital Signs Assessment: post-procedure vital signs reviewed and stable Respiratory status: spontaneous breathing, nonlabored ventilation and respiratory function stable Cardiovascular status: blood pressure returned to baseline and stable Postop Assessment: no apparent nausea or vomiting Anesthetic complications: no   No notable events documented.  Last Vitals:  Vitals:   06/10/22 1145 06/10/22 1200  BP: (!) 175/96 (!) 155/97  Pulse: 68 71  Resp: 16 12  Temp:    SpO2: 100% 95%    Last Pain:  Vitals:   06/10/22 1200  TempSrc:   PainSc: 0-No pain                 Anjannette Gauger,E. Naphtali Riede

## 2022-06-10 NOTE — Anesthesia Procedure Notes (Signed)
Procedure Name: Intubation Date/Time: 06/10/2022 10:09 AM  Performed by: Lavina Hamman, CRNAPre-anesthesia Checklist: Patient identified, Emergency Drugs available, Suction available, Patient being monitored and Timeout performed Patient Re-evaluated:Patient Re-evaluated prior to induction Oxygen Delivery Method: Circle system utilized Preoxygenation: Pre-oxygenation with 100% oxygen Induction Type: IV induction Ventilation: Mask ventilation without difficulty and Oral airway inserted - appropriate to patient size Laryngoscope Size: Mac and 4 Grade View: Grade I Tube type: Oral Tube size: 7.5 mm Number of attempts: 1 Airway Equipment and Method: Stylet Placement Confirmation: ETT inserted through vocal cords under direct vision, positive ETCO2, CO2 detector and breath sounds checked- equal and bilateral Secured at: 23 cm Tube secured with: Tape Dental Injury: Teeth and Oropharynx as per pre-operative assessment  Comments: ATOI

## 2022-06-11 ENCOUNTER — Other Ambulatory Visit: Payer: Self-pay

## 2022-06-11 DIAGNOSIS — N401 Enlarged prostate with lower urinary tract symptoms: Secondary | ICD-10-CM | POA: Diagnosis not present

## 2022-06-11 LAB — HEMOGLOBIN AND HEMATOCRIT, BLOOD
HCT: 43.3 % (ref 39.0–52.0)
Hemoglobin: 14.5 g/dL (ref 13.0–17.0)

## 2022-06-11 MED ORDER — ORAL CARE MOUTH RINSE: 15.0000 mL | OROMUCOSAL | Status: DC | PRN

## 2022-06-11 NOTE — Discharge Summary (Signed)
.  mre Physician Discharge Summary  Patient ID: Mike Freeman MRN: [id number] DOB/AGE: November 25, 1945 77 y.o.  Admit date: 06/10/2022 Discharge date: 06/11/2022  Admission Diagnoses:  Discharge Diagnoses:  Principal Problem:   BPH with obstruction/lower urinary tract symptoms   Discharged Condition: good  Hospital Course: Patient was admitted following water jet ablation of prostate.  He was maintained on CBI which was stopped this morning.  His urine remains clear.  He is tolerating a regular diet and ready for discharge.  Consults: None  Significant Diagnostic Studies: none  Treatments: surgery: Robotic water jet ablation of prostate  Discharge Exam: Blood pressure (!) 147/83, pulse 66, temperature 97.7 F (36.5 C), temperature source Oral, resp. rate 20, height '5\' 10"'$  (1.778 m), weight 117.9 kg, SpO2 98 %. He looks well, watching TV Alert and oriented Cardiovascular-regular rate and rhythm Respiratory-regular effort and depth Abdomen-soft and nontender GU-Foley catheter in place urine light pink-grade 2 Extremity-no calf pain or swelling, SCDs in place  Disposition: Discharge disposition: 01-Home or Self Care       Discharge Instructions     Continue foley catheter   Complete by: As directed    Continue traction for 2 hours.  If hematuria remains Grade 1 or 2, take off traction and continue CBI for 30 minutes. If hematuria remains Grade 1 and 2, turn off CBI. If hematuria remains Grade 1 to 2, discharge to home with foley to gravity.   Continue foley catheter   Complete by: As directed    Discharge patient   Complete by: As directed    Discharge disposition: 01-Home or Self Care   Discharge patient date: 06/11/2022      Allergies as of 06/11/2022   No Known Allergies      Medication List     STOP taking these medications    finasteride 5 MG tablet Commonly known as: PROSCAR       TAKE these medications    amLODipine 10 MG tablet Commonly known as:  NORVASC Take 10 mg by mouth daily.   cephALEXin 500 MG capsule Commonly known as: KEFLEX Take 1 capsule (500 mg total) by mouth at bedtime.   losartan-hydrochlorothiazide 100-25 MG tablet Commonly known as: HYZAAR Take 1 tablet by mouth daily.   metFORMIN 500 MG tablet Commonly known as: GLUCOPHAGE Take 500 mg by mouth 2 (two) times daily.   multivitamin with minerals Tabs tablet Take 1 tablet by mouth daily.   OVER THE COUNTER MEDICATION Take 1-2 tablets by mouth at bedtime as needed (sleep). Relaxium sleep aid   QUERCETIN PO Take 500 mg by mouth daily.   Spirulina Powd Take 1 Scoop by mouth daily.        Follow-up Information     Festus Aloe, MD Follow up on 06/17/2022.   Specialty: Urology Why: at 10:30 AM with NP Jimmie Molly information: [address] [address] [phone]                 Signed: Festus Aloe 06/11/2022, 11:44 AM

## 2022-06-13 LAB — SURGICAL PATHOLOGY

## 2023-08-21 DIAGNOSIS — J019 Acute sinusitis, unspecified: Secondary | ICD-10-CM | POA: Diagnosis not present

## 2023-09-01 DIAGNOSIS — Z0000 Encounter for general adult medical examination without abnormal findings: Secondary | ICD-10-CM | POA: Diagnosis not present

## 2023-12-15 DEATH — deceased

## 2024-03-01 ENCOUNTER — Emergency Department (HOSPITAL_BASED_OUTPATIENT_CLINIC_OR_DEPARTMENT_OTHER)

## 2024-03-01 ENCOUNTER — Other Ambulatory Visit: Payer: Self-pay

## 2024-03-01 ENCOUNTER — Encounter (HOSPITAL_BASED_OUTPATIENT_CLINIC_OR_DEPARTMENT_OTHER): Payer: Self-pay

## 2024-03-01 ENCOUNTER — Emergency Department (HOSPITAL_BASED_OUTPATIENT_CLINIC_OR_DEPARTMENT_OTHER)
Admission: EM | Admit: 2024-03-01 | Discharge: 2024-03-01 | Disposition: A | Discharge status: Home / Self Care | Attending: Emergency Medicine | Admitting: Emergency Medicine

## 2024-03-01 DIAGNOSIS — E119 Type 2 diabetes mellitus without complications: Secondary | ICD-10-CM | POA: Diagnosis not present

## 2024-03-01 DIAGNOSIS — S59901A Unspecified injury of right elbow, initial encounter: Secondary | ICD-10-CM | POA: Insufficient documentation

## 2024-03-01 DIAGNOSIS — W19XXXA Unspecified fall, initial encounter: Secondary | ICD-10-CM | POA: Diagnosis not present

## 2024-03-01 DIAGNOSIS — I10 Essential (primary) hypertension: Secondary | ICD-10-CM | POA: Diagnosis not present

## 2024-03-01 DIAGNOSIS — Z043 Encounter for examination and observation following other accident: Secondary | ICD-10-CM | POA: Diagnosis not present

## 2024-03-01 NOTE — ED Triage Notes (Signed)
 Pt c/o mechanical fall onto R arm yesterday. States he landed w all my weight on it. Reports relief w extra-strength tylenol , took just PTA

## 2024-03-01 NOTE — Discharge Instructions (Signed)
 X-ray of the right elbow negative for any acute fracture.  What we saw was old.  Wear the shoulder immobilizer as needed for comfort.  Can continue with the extra strength Tylenol .  Make an appointment to follow-up with orthopedics.  Or he can follow back up at the [facility].

## 2024-03-01 NOTE — ED Provider Notes (Signed)
 [place] EMERGENCY DEPARTMENT AT [facility] Provider Note   CSN: [id number] Arrival date & time: 03/01/24  1343     Patient presents with: No chief complaint on file.   Mike Freeman is a 78 y.o. male.   Patient with a mechanical fall landing on his right elbow yesterday.  Did not hurt anything else.  Patient reports relief with Exer strength Tylenol .  Just took some prior to arrival.  Temperature 98.2 pulse 84 resp rations 16 blood pressure 121/76 oxygen sats 96% on room air.  Past medical history is known for hypertension and diabetes.  Patient is followed by the [facility] clinic.  He did not hit his head no loss consciousness.  Patient is not on blood thinners.  Is on Norvasc  and metformin .        Prior to Admission medications   Medication Sig Start Date End Date Taking? Authorizing Provider  amLODipine  (NORVASC ) 10 MG tablet Take 10 mg by mouth daily.    [provider]  cephALEXin  (KEFLEX ) 500 MG capsule Take 1 capsule (500 mg total) by mouth at bedtime. 06/10/22   Nieves Cough, MD  losartan -hydrochlorothiazide  (HYZAAR) 100-25 MG tablet Take 1 tablet by mouth daily.    [provider]  metFORMIN  (GLUCOPHAGE ) 500 MG tablet Take 500 mg by mouth 2 (two) times daily.    [provider]  Multiple Vitamin (MULTIVITAMIN WITH MINERALS) TABS tablet Take 1 tablet by mouth daily.    [provider]  OVER THE COUNTER MEDICATION Take 1-2 tablets by mouth at bedtime as needed (sleep). Relaxium sleep aid    [provider]  QUERCETIN PO Take 500 mg by mouth daily.    [provider]  Spirulina POWD Take 1 Scoop by mouth daily.    [provider]    Allergies: Patient has no known allergies.    Review of Systems  Constitutional:  Negative for chills and fever.  HENT:  Negative for ear pain and sore throat.   Eyes:  Negative for pain and visual disturbance.  Respiratory:  Negative for cough and shortness  of breath.   Cardiovascular:  Negative for chest pain and palpitations.  Gastrointestinal:  Negative for abdominal pain and vomiting.  Genitourinary:  Negative for dysuria and hematuria.  Musculoskeletal:  Positive for joint swelling. Negative for arthralgias and back pain.  Skin:  Negative for color change and rash.  Neurological:  Negative for seizures and syncope.  All other systems reviewed and are negative.   Updated Vital Signs BP 121/76   Pulse 84   Temp 98.2 F (36.8 C)   Resp 16   SpO2 96%   Physical Exam Vitals and nursing note reviewed.  Constitutional:      General: He is not in acute distress.    Appearance: Normal appearance. He is well-developed.  HENT:     Head: Normocephalic and atraumatic.  Eyes:     Extraocular Movements: Extraocular movements intact.     Conjunctiva/sclera: Conjunctivae normal.     Pupils: Pupils are equal, round, and reactive to light.  Cardiovascular:     Rate and Rhythm: Normal rate and regular rhythm.     Heart sounds: No murmur heard. Pulmonary:     Effort: Pulmonary effort is normal. No respiratory distress.     Breath sounds: Normal breath sounds.  Abdominal:     Palpations: Abdomen is soft.     Tenderness: There is no abdominal tenderness.  Musculoskeletal:  General: Swelling, tenderness and signs of injury present.     Cervical back: Normal range of motion and neck supple. No rigidity or tenderness.     Comments: Swelling and tenderness to the right elbow area.  Good range of motion at the wrist and fingers and shoulder.  Actually pretty good range of motion of the elbow as well.  Radial pulses 2+.  Sensation intact.  No other lower extremity or upper extremity injuries.  Back is nontender.  Skin:    General: Skin is warm and dry.     Capillary Refill: Capillary refill takes less than 2 seconds.  Neurological:     General: No focal deficit present.     Mental Status: He is alert and oriented to person, place, and time.   Psychiatric:        Mood and Affect: Mood normal.     (all labs ordered are listed, but only abnormal results are displayed) Labs Reviewed - No data to display  EKG: None  Radiology: DG Elbow 2 Views Right Result Date: 03/01/2024 EXAM: 1 or 2 VIEW(S) XRAY OF THE RIGHT ELBOW COMPARISON: None available. CLINICAL HISTORY: fall. Pt c/o mechanical fall onto R arm yesterday. States he landed w all my weight on it. Reports relief w extra-strength tylenol , took just PTA ; Best images acquired due to pt tolerance FINDINGS: BONES AND JOINTS: No acute fracture. No focal osseous lesion. No joint dislocation. No joint effusion. SOFT TISSUES: 2 rounded bone densities are noted posterior to distal humerus which may represent old post-traumatic injury of the soft tissues. IMPRESSION: 1. No acute fracture or dislocation. 2. Two rounded ossific densities posterior to the distal humerus, compatible with sequelae of prior injury to the soft tissues. Electronically signed by: Lynwood Seip MD 03/01/2024 03:18 PM EDT RP Workstation: [id number]     Procedures   Medications Ordered in the ED - No data to display                                  Medical Decision Making Amount and/or Complexity of Data Reviewed Radiology: ordered.   X-ray negative for any acute fracture or dislocation.  There is 2 rounded ossified densities posterior to the distal humerus compatible with sequelae of prior injury at the soft tissue.  Will treat with a shoulder immobilizer and have him follow-up with orthopedics  Final diagnoses:  Injury of right elbow, initial encounter    ED Discharge Orders     None          Geraldene Hamilton, MD 03/01/24 [phone]
